# Patient Record
Sex: Male | Born: 1975 | Race: White | Hispanic: No | Marital: Single
Health system: Southern US, Community
[De-identification: ages and names within clinical notes are randomized; demographics above are authoritative.]

---

## 2020-06-21 ENCOUNTER — Other Ambulatory Visit: Payer: Self-pay

## 2020-06-21 ENCOUNTER — Encounter (HOSPITAL_COMMUNITY): Payer: Self-pay

## 2020-06-21 DIAGNOSIS — F1010 Alcohol abuse, uncomplicated: Secondary | ICD-10-CM | POA: Diagnosis not present

## 2020-06-21 DIAGNOSIS — Y999 Unspecified external cause status: Secondary | ICD-10-CM | POA: Insufficient documentation

## 2020-06-21 DIAGNOSIS — Y929 Unspecified place or not applicable: Secondary | ICD-10-CM | POA: Diagnosis not present

## 2020-06-21 DIAGNOSIS — S60511A Abrasion of right hand, initial encounter: Secondary | ICD-10-CM | POA: Insufficient documentation

## 2020-06-21 DIAGNOSIS — W1839XA Other fall on same level, initial encounter: Secondary | ICD-10-CM | POA: Insufficient documentation

## 2020-06-21 DIAGNOSIS — Y939 Activity, unspecified: Secondary | ICD-10-CM | POA: Insufficient documentation

## 2020-06-21 DIAGNOSIS — S6991XA Unspecified injury of right wrist, hand and finger(s), initial encounter: Secondary | ICD-10-CM | POA: Diagnosis present

## 2020-06-21 NOTE — ED Triage Notes (Signed)
Pt says [employer] brought patient here because he had been drinking. Pt has no complaint.

## 2020-06-22 ENCOUNTER — Emergency Department (HOSPITAL_COMMUNITY)
Admission: EM | Admit: 2020-06-22 | Discharge: 2020-06-22 | Disposition: A | Discharge status: Home / Self Care | Payer: 59 | Attending: Emergency Medicine | Admitting: Emergency Medicine

## 2020-06-22 ENCOUNTER — Emergency Department (HOSPITAL_COMMUNITY): Payer: 59

## 2020-06-22 DIAGNOSIS — S60511A Abrasion of right hand, initial encounter: Secondary | ICD-10-CM

## 2020-06-22 DIAGNOSIS — F1010 Alcohol abuse, uncomplicated: Secondary | ICD-10-CM

## 2020-06-22 MED ORDER — AMOXICILLIN-POT CLAVULANATE 875-125 MG PO TABS: 1.0000 | ORAL_TABLET | Freq: Two times a day (BID) | ORAL | 0 refills | Status: AC

## 2020-06-22 MED ORDER — LORAZEPAM 1 MG PO TABS
1.0000 mg | ORAL_TABLET | Freq: Once | ORAL | Status: AC
  Administered 2020-06-22: 1 mg via ORAL
  Filled 2020-06-22: qty 1

## 2020-06-22 MED ORDER — CHLORDIAZEPOXIDE HCL 25 MG PO CAPS: ORAL_CAPSULE | ORAL | 0 refills | Status: AC

## 2020-06-22 NOTE — Discharge Instructions (Addendum)
Please use the resource guide in order to reach out to substance abuse and detox centers.  Please make sure to take the Librium, DO NOT DRINK ON THIS MEDICATION AS IT WILL DECREASE YOUR RESPIRATORY RATE AND MAY CAUSE DEATH.  I have also contacted our peers support group which should be reaching out to you.    Please take the antibiotic as directed until finished to prevent infection.  Return if you have worsening redness, swelling, pain, drainage, fevers or chills.  Return to the ER if you have worsening symptoms, thoughts of hurting herself or others.
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
## 2021-02-26 IMAGING — CR DG HAND COMPLETE 3+V*R*
3 series · 3 of 3 positions shown · non-contrast
Comparison: None.

CLINICAL DATA: Pain following fall

EXAM:
RIGHT HAND - COMPLETE 3+ VIEW

[x hand pa right]
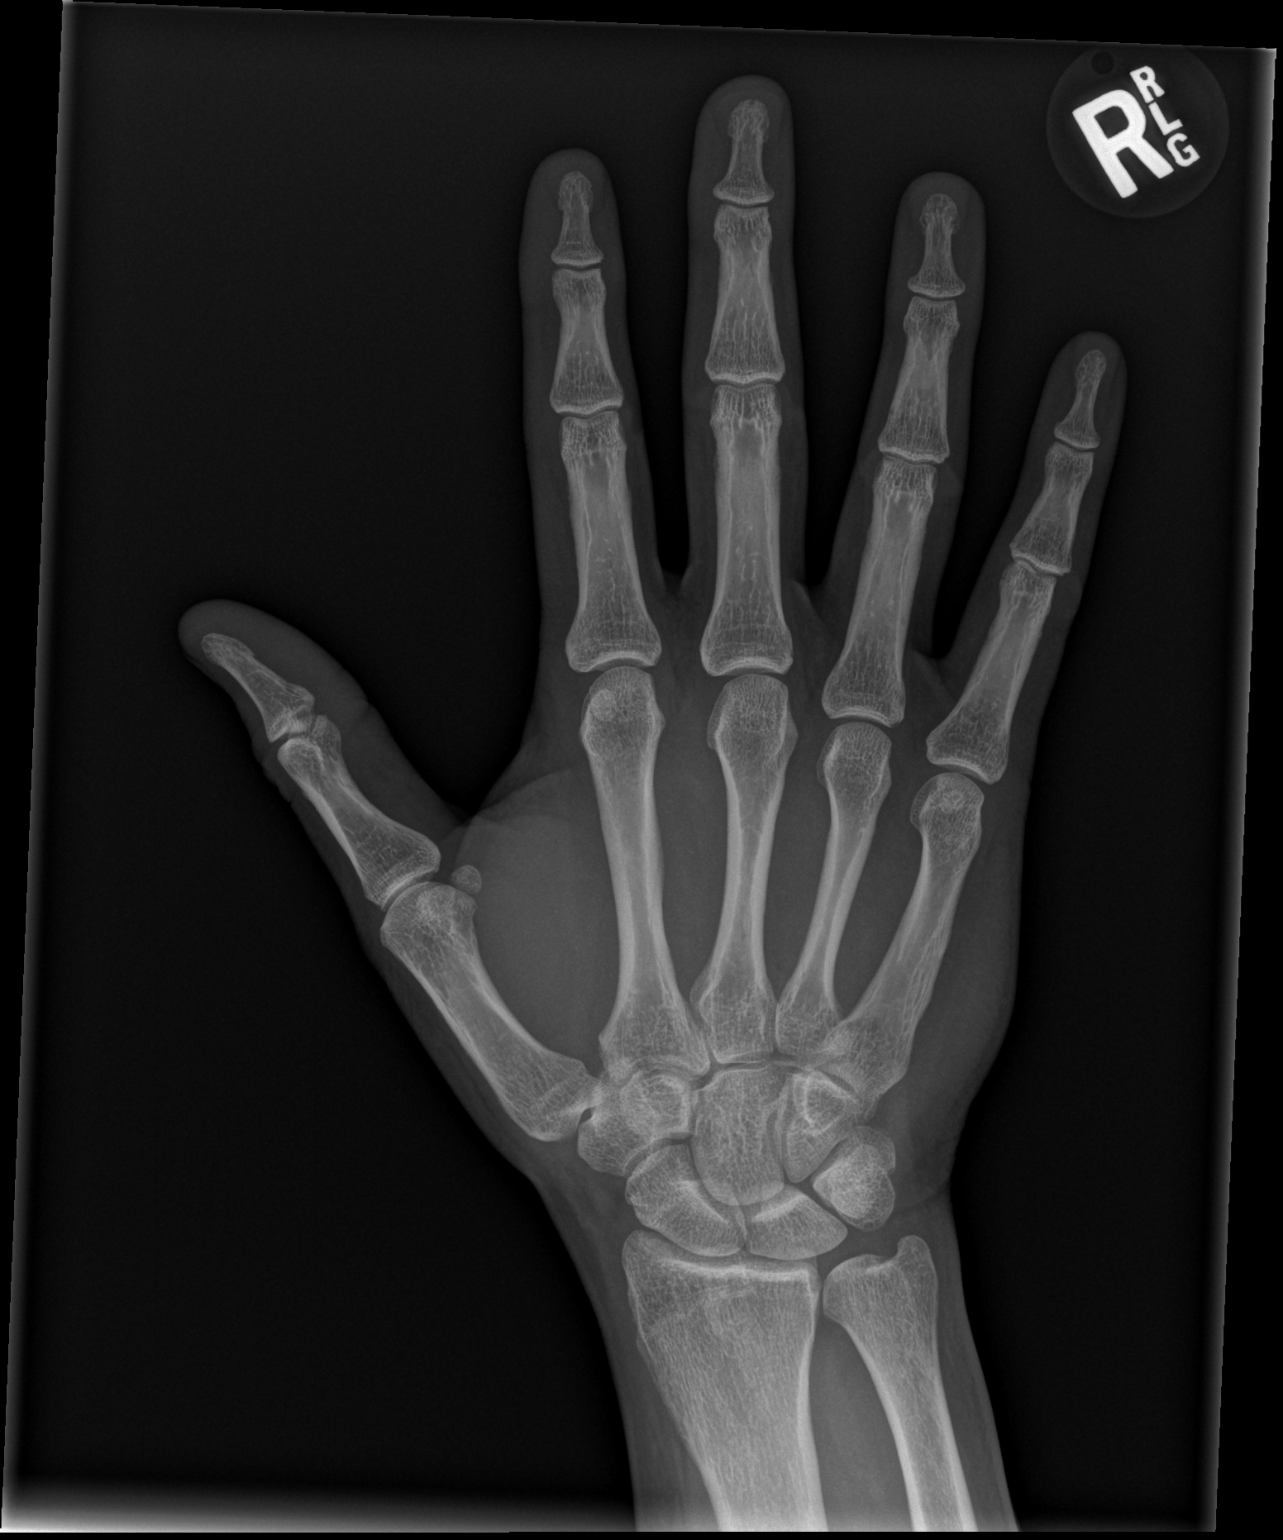

[x hand obl right]
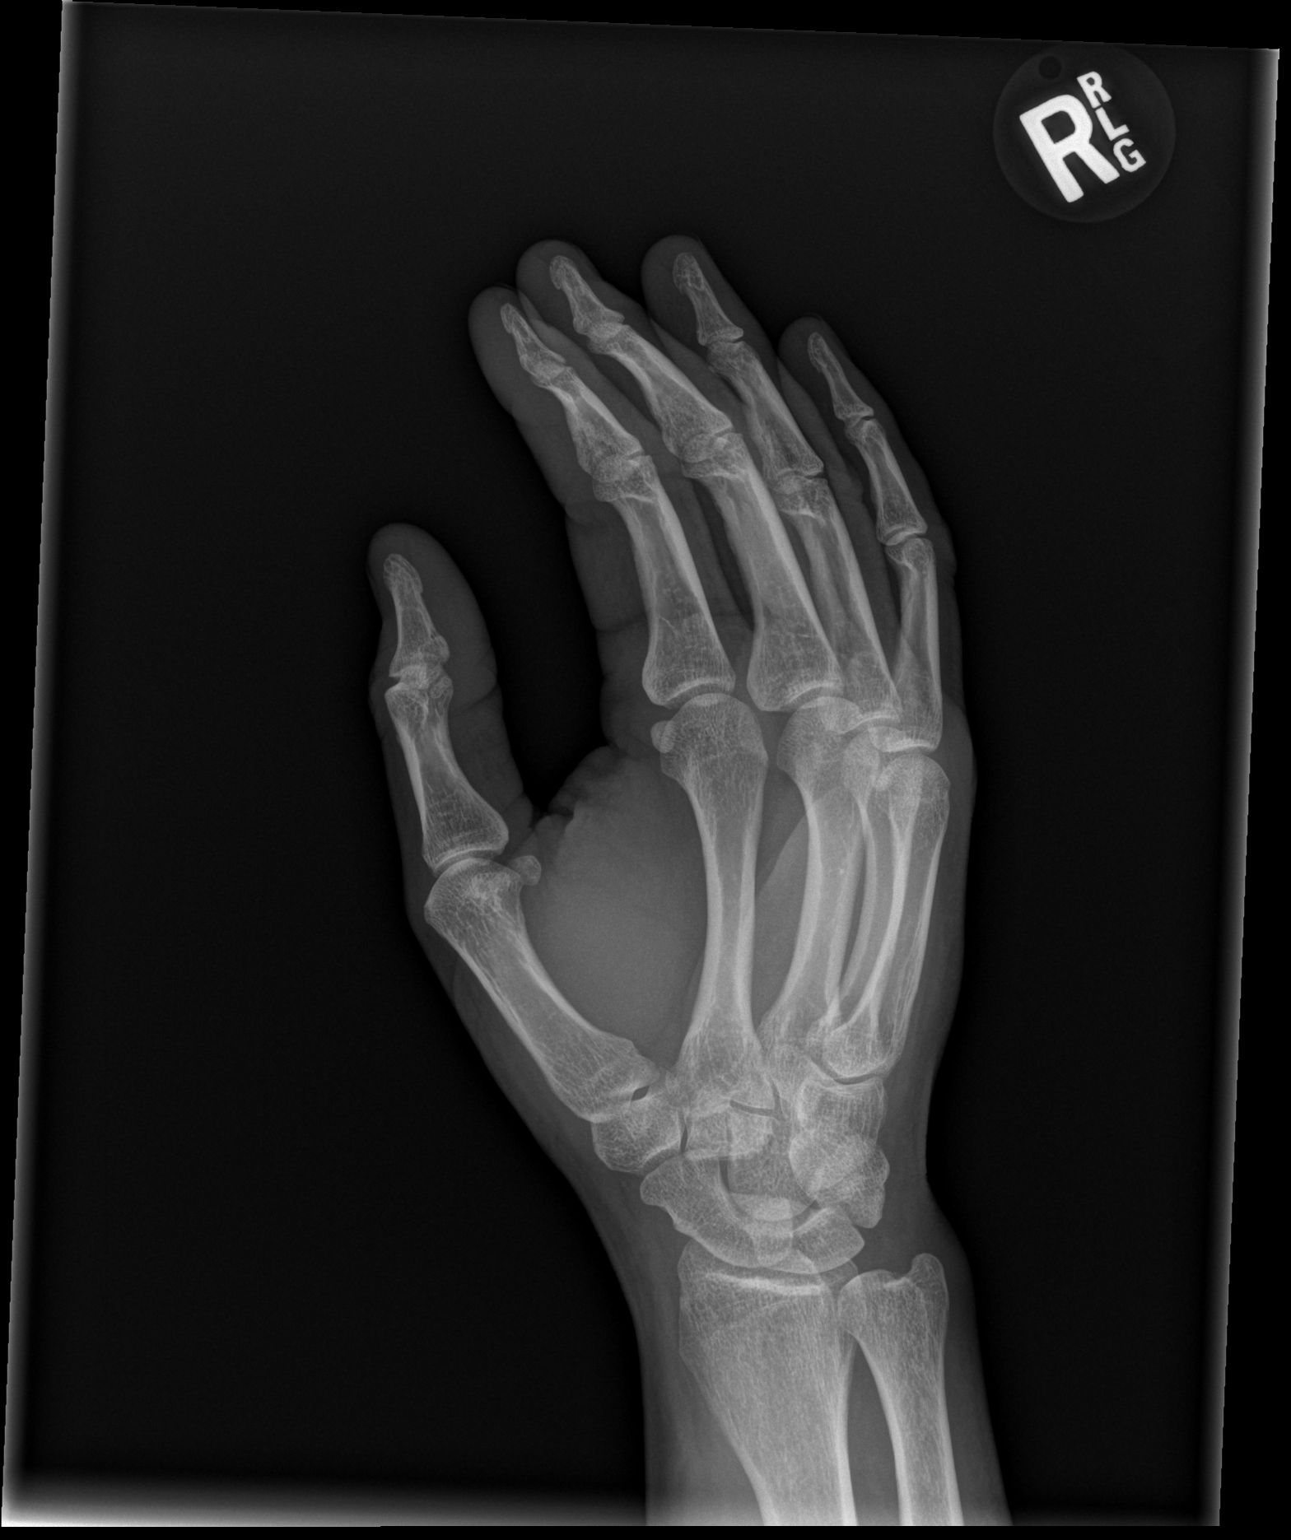

[x hand lat right]
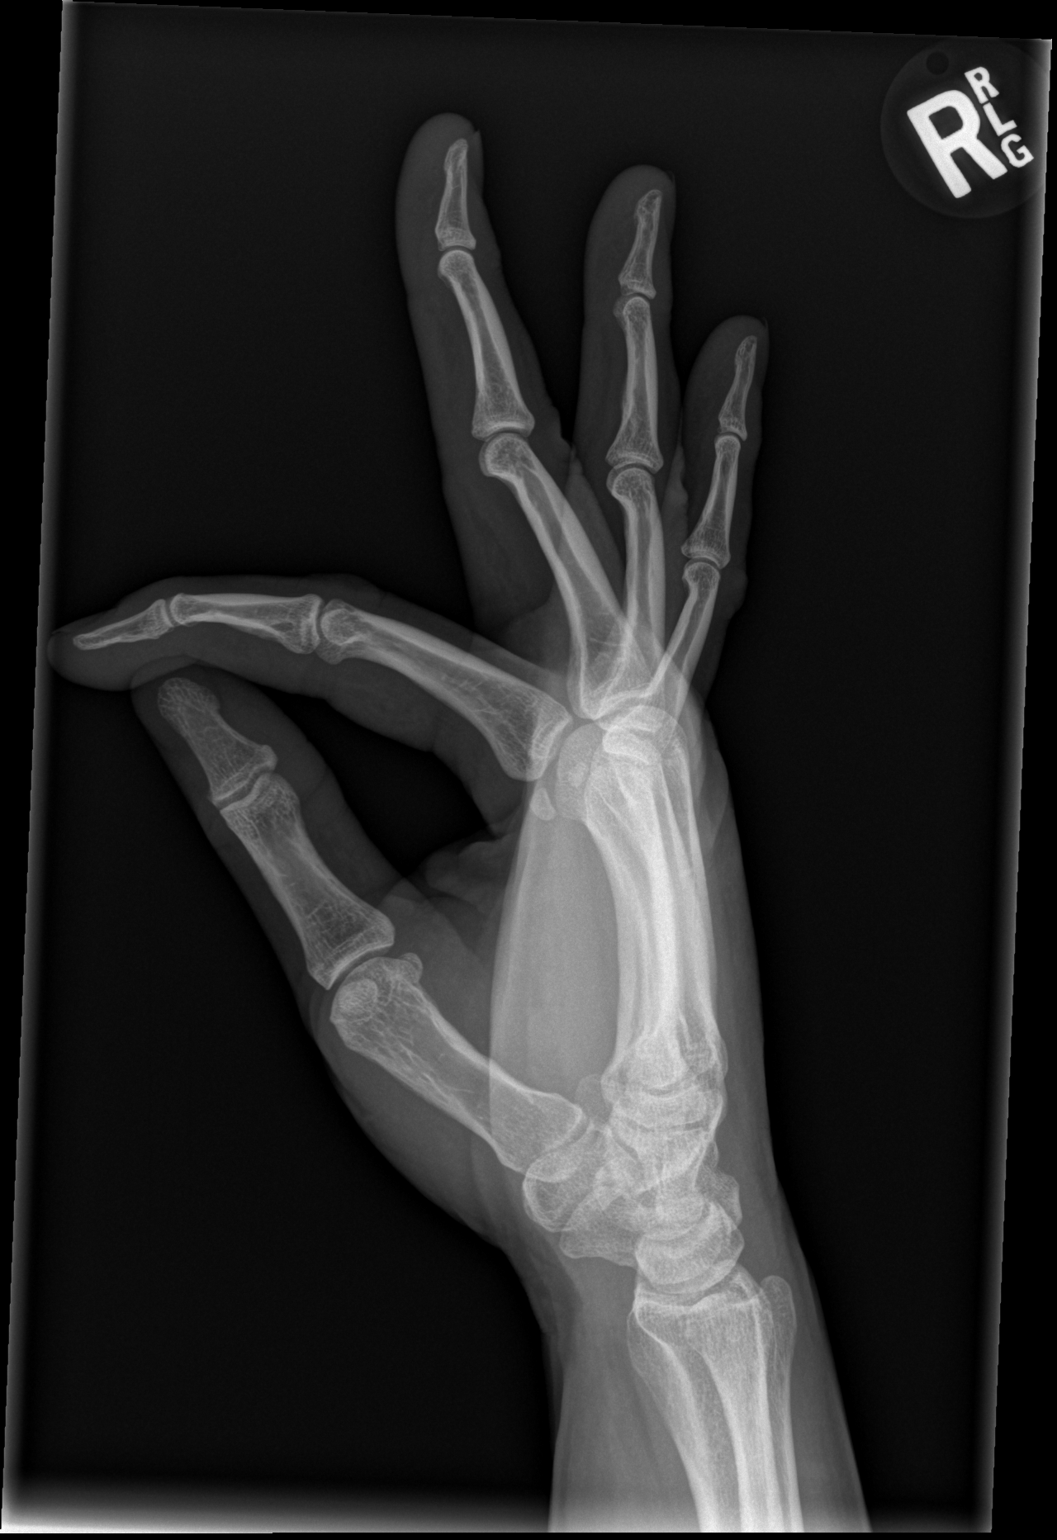

[3 of 3 positions shown; findings below may reference images not displayed]

FINDINGS: Frontal, oblique, and lateral views were obtained. No fracture or
dislocation. Joint spaces appear normal. No erosive change.
IMPRESSION: No fracture or dislocation.  No evident arthropathy.
